# Patient Record
Sex: Female | Born: 1975 | Race: Black or African American | Hispanic: No | Marital: Single | Smoking: Never smoker
Health system: Southern US, Community
[De-identification: ages and names within clinical notes are randomized; demographics above are authoritative.]

---

## 2004-11-30 ENCOUNTER — Other Ambulatory Visit: Admission: RE | Admit: 2004-11-30 | Discharge: 2004-11-30 | Payer: Self-pay | Admitting: Family Medicine

## 2007-11-06 ENCOUNTER — Other Ambulatory Visit: Admission: RE | Admit: 2007-11-06 | Discharge: 2007-11-06 | Payer: Self-pay | Admitting: Family Medicine

## 2008-06-12 ENCOUNTER — Encounter: Admission: RE | Admit: 2008-06-12 | Discharge: 2008-06-12 | Payer: Self-pay | Admitting: Family Medicine

## 2009-05-07 ENCOUNTER — Other Ambulatory Visit: Admission: RE | Admit: 2009-05-07 | Discharge: 2009-05-07 | Payer: Self-pay | Admitting: Family Medicine

## 2009-09-23 ENCOUNTER — Ambulatory Visit: Admission: RE | Admit: 2009-09-23 | Discharge: 2009-09-23 | Payer: Self-pay | Admitting: Family Medicine

## 2011-08-16 ENCOUNTER — Other Ambulatory Visit (HOSPITAL_COMMUNITY)
Admission: RE | Admit: 2011-08-16 | Discharge: 2011-08-16 | Disposition: A | Discharge status: Home / Self Care | Payer: 59 | Source: Ambulatory Visit | Attending: Family Medicine | Admitting: Family Medicine

## 2011-08-16 DIAGNOSIS — Z113 Encounter for screening for infections with a predominantly sexual mode of transmission: Secondary | ICD-10-CM | POA: Insufficient documentation

## 2011-08-16 DIAGNOSIS — Z0000 Encounter for general adult medical examination without abnormal findings: Secondary | ICD-10-CM | POA: Insufficient documentation

## 2012-09-26 ENCOUNTER — Other Ambulatory Visit (HOSPITAL_COMMUNITY)
Admission: RE | Admit: 2012-09-26 | Discharge: 2012-09-26 | Disposition: A | Discharge status: Home / Self Care | Payer: 59 | Source: Ambulatory Visit | Attending: Family Medicine | Admitting: Family Medicine

## 2012-09-26 DIAGNOSIS — Z01419 Encounter for gynecological examination (general) (routine) without abnormal findings: Secondary | ICD-10-CM | POA: Insufficient documentation

## 2013-10-17 ENCOUNTER — Other Ambulatory Visit (HOSPITAL_COMMUNITY)
Admission: RE | Admit: 2013-10-17 | Discharge: 2013-10-17 | Disposition: A | Discharge status: Home / Self Care | Payer: 59 | Source: Ambulatory Visit | Attending: Physician Assistant | Admitting: Physician Assistant

## 2013-10-17 DIAGNOSIS — Z01419 Encounter for gynecological examination (general) (routine) without abnormal findings: Secondary | ICD-10-CM | POA: Insufficient documentation

## 2013-10-17 DIAGNOSIS — Z113 Encounter for screening for infections with a predominantly sexual mode of transmission: Secondary | ICD-10-CM | POA: Insufficient documentation

## 2016-03-11 ENCOUNTER — Emergency Department (HOSPITAL_BASED_OUTPATIENT_CLINIC_OR_DEPARTMENT_OTHER)
Admission: EM | Admit: 2016-03-11 | Discharge: 2016-03-11 | Disposition: A | Discharge status: Home / Self Care | Payer: Worker's Compensation | Attending: Emergency Medicine | Admitting: Emergency Medicine

## 2016-03-11 ENCOUNTER — Encounter (HOSPITAL_BASED_OUTPATIENT_CLINIC_OR_DEPARTMENT_OTHER): Payer: Self-pay

## 2016-03-11 ENCOUNTER — Emergency Department (HOSPITAL_BASED_OUTPATIENT_CLINIC_OR_DEPARTMENT_OTHER): Payer: Worker's Compensation

## 2016-03-11 DIAGNOSIS — Y9263 Factory as the place of occurrence of the external cause: Secondary | ICD-10-CM | POA: Insufficient documentation

## 2016-03-11 DIAGNOSIS — IMO0002 Reserved for concepts with insufficient information to code with codable children: Secondary | ICD-10-CM

## 2016-03-11 DIAGNOSIS — R0789 Other chest pain: Secondary | ICD-10-CM

## 2016-03-11 DIAGNOSIS — Y9301 Activity, walking, marching and hiking: Secondary | ICD-10-CM | POA: Insufficient documentation

## 2016-03-11 DIAGNOSIS — S21112A Laceration without foreign body of left front wall of thorax without penetration into thoracic cavity, initial encounter: Secondary | ICD-10-CM | POA: Diagnosis not present

## 2016-03-11 DIAGNOSIS — W01198A Fall on same level from slipping, tripping and stumbling with subsequent striking against other object, initial encounter: Secondary | ICD-10-CM | POA: Diagnosis not present

## 2016-03-11 DIAGNOSIS — Y990 Civilian activity done for income or pay: Secondary | ICD-10-CM | POA: Diagnosis not present

## 2016-03-11 MED ORDER — TETANUS-DIPHTH-ACELL PERTUSSIS 5-2.5-18.5 LF-MCG/0.5 IM SUSP
0.5000 mL | Freq: Once | INTRAMUSCULAR | Status: AC
  Administered 2016-03-11: 0.5 mL via INTRAMUSCULAR
  Filled 2016-03-11: qty 0.5

## 2016-03-11 NOTE — Discharge Instructions (Signed)
Keep wound covered with nonstick bandages (Tefla). You may apply OTC antibiotic ointment such as neosporin or polysporin. Return to ED with new, worsening or concerning symptoms.   Wound Care Taking care of your wound properly can help to prevent pain and infection. It can also help your wound to heal more quickly.  HOW TO CARE FOR YOUR WOUND  Take or apply over-the-counter and prescription medicines only as told by your health care provider.  If you were prescribed antibiotic medicine, take or apply it as told by your health care provider. Do not stop using the antibiotic even if your condition improves.  Clean the wound each day or as told by your health care provider.  Wash the wound with mild soap and water.  Rinse the wound with water to remove all soap.  Pat the wound dry with a clean towel. Do not rub it.  There are many different ways to close and cover a wound. For example, a wound can be covered with stitches (sutures), skin glue, or adhesive strips. Follow instructions from your health care provider about:  How to take care of your wound.  When and how you should change your bandage (dressing).  When you should remove your dressing.  Removing whatever was used to close your wound.  Check your wound every day for signs of infection. Watch for:  Redness, swelling, or pain.  Fluid, blood, or pus.  Keep the dressing dry until your health care provider says it can be removed. Do not take baths, swim, use a hot tub, or do anything that would put your wound underwater until your health care provider approves.  Raise (elevate) the injured area above the level of your heart while you are sitting or lying down.  Do not scratch or pick at the wound.  Keep all follow-up visits as told by your health care provider. This is important. SEEK MEDICAL CARE IF:  You received a tetanus shot and you have swelling, severe pain, redness, or bleeding at the injection site.  You have  a fever.  Your pain is not controlled with medicine.  You have increased redness, swelling, or pain at the site of your wound.  You have fluid, blood, or pus coming from your wound.  You notice a bad smell coming from your wound or your dressing. SEEK IMMEDIATE MEDICAL CARE IF:  You have a red streak going away from your wound.   This information is not intended to replace advice given to you by your health care provider. Make sure you discuss any questions you have with your health care provider.   Document Released: 07/27/2008 Document Revised: 03/04/2015 Document Reviewed: 10/14/2014 Elsevier Interactive Patient Education 2016 [employer]   Chest Wall Pain Chest wall pain is pain in or around the bones and muscles of your chest. Sometimes, an injury causes this pain. Sometimes, the cause may not be known. This pain may take several weeks or longer to get better. HOME CARE INSTRUCTIONS  Pay attention to any changes in your symptoms. Take these actions to help with your pain:   Rest as told by your health care provider.   Avoid activities that cause pain. These include any activities that use your chest muscles or your abdominal and side muscles to lift heavy items.   If directed, apply ice to the painful area:  Put ice in a plastic bag.  Place a towel between your skin and the bag.  Leave the ice on for 20 minutes,  2-3 times per day.  Take over-the-counter and prescription medicines only as told by your health care provider.  Do not use tobacco products, including cigarettes, chewing tobacco, and e-cigarettes. If you need help quitting, ask your health care provider.  Keep all follow-up visits as told by your health care provider. This is important. SEEK MEDICAL CARE IF:  You have a fever.  Your chest pain becomes worse.  You have new symptoms. SEEK IMMEDIATE MEDICAL CARE IF:  You have nausea or vomiting.  You feel sweaty or light-headed.  You have a  cough with phlegm (sputum) or you cough up blood.  You develop shortness of breath.   This information is not intended to replace advice given to you by your health care provider. Make sure you discuss any questions you have with your health care provider.   Document Released: 10/18/2005 Document Revised: 07/09/2015 Document Reviewed: 01/13/2015 Elsevier Interactive Patient Education [employer]

## 2016-03-11 NOTE — ED Notes (Signed)
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

## 2016-03-11 NOTE — ED Provider Notes (Signed)
CSN: [id number]     Arrival date & time 03/11/16  1127 History   First MD Initiated Contact with Patient 03/11/16 1128     Chief Complaint  Patient presents with  . Laceration   Patient is a 40 y.o. female presenting with skin laceration.  Laceration Location:  Trunk Trunk laceration location:  L chest Length (cm):  6 Depth:  Cutaneous Quality: straight   Bleeding: controlled   Laceration mechanism:  Metal edge Pain details:    Quality: Tender.   Severity:  Mild Foreign body present:  No foreign bodies Relieved by:  None tried Tetanus status:  Unknown  Sheila Tanner is a 40 year old female presenting with skin laceration. 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 She struck her left sided chest against the metal edge and sustained a superficial laceration to her anterior chest wall. Small amount of bleeding noted which resolved with wound dressing. She reports moderate pain of the anterior chest where she struck the frame. Denies SOB or any difficulty breathing. She has no other complaints today. Unsure of last tetanus.   History reviewed. No pertinent past medical history. History reviewed. No pertinent past surgical history. No family history on file. Social History  Substance Use Topics  . Smoking status: Never Smoker   . Smokeless tobacco: None  . Alcohol Use: Yes     Comment: occ   OB History    No data available     Review of Systems  All other systems reviewed and are negative.     Allergies  Review of patient's allergies indicates no known allergies.  Home Medications   Prior to Admission medications   Not on File   BP 137/89 mmHg  Pulse 94  Temp(Src) 98.8 F (37.1 C) (Oral)  Resp 18  Ht 5\' 1"  (1.549 m)  Wt 90.719 kg  BMI 37.81 kg/m2  SpO2 98%  LMP 02/27/2016 Physical Exam  Constitutional: She appears well-developed and well-nourished. No distress.  HENT:  Head: Normocephalic and atraumatic.  Eyes: Conjunctivae  are normal. Right eye exhibits no discharge. Left eye exhibits no discharge. No scleral icterus.  Neck: Normal range of motion.  Cardiovascular: Normal rate and regular rhythm.   Pulmonary/Chest: Effort normal. No respiratory distress. She exhibits tenderness.  Mild TTP over left anterior chest wall at site of abrasion. No bony deformities of the chest wall. Breathing unlabored with good air movement in all fields.   Musculoskeletal: Normal range of motion.  Neurological: She is alert. Coordination normal.  Skin: Skin is warm and dry.  Linear skin abrasion noted to left anterior chest wall. Length of approximately 6 cm. Wound is superficial with a depth of less than 5 mm. No foreign bodies. No active bleeding. Mild soft tissue swelling at site. Wound does not require suture repair.   Psychiatric: She has a normal mood and affect. Her behavior is normal.  Nursing note and vitals reviewed.   ED Course  Procedures (including critical care time) Labs Review Labs Reviewed - No data to display  Imaging Review Dg Chest 2 View  03/11/2016  CLINICAL DATA:  Super sternal chest wall laceration at work today. Initial encounter. EXAM: CHEST  2 VIEW COMPARISON:  None. FINDINGS: The heart size and mediastinal contours are normal. The lungs are clear. There is no pleural effusion or pneumothorax. No acute osseous findings are identified. No soft tissue emphysema, suprasternal soft tissue swelling or foreign body identified. IMPRESSION: No evidence  of acute chest injury. No active cardiopulmonary process. Electronically Signed   By: Carey Bullocks M.D.   On: 03/11/2016 12:26   I have personally reviewed and evaluated these images and lab results as part of my medical decision-making.   EKG Interpretation None      MDM   Final diagnoses:  Laceration  Chest wall pain   40 year old female presenting with skin laceration after a fall. Pt struck left anterior chest against metal edge resulting in  tenderness and laceration. Pt initially hypertensive in triage which improved to 37/89 on re-measure. Pt is nontoxic appearing. 6 cm superficial, linear abrasion noted to left anterior chest. Surrounding TTP. No deformities of left chest wall. Xray negative for acute injury. TDAP updated. Wound does not require suture repair. Will cover with Tefla and instruct pt on wound care including dressing changes and abx ointment. Discussed reasons to return to ED including signs of infection or change in chest pain. Return precautions given in discharge paperwork and discussed with pt at bedside. Pt stable for discharge     Alveta Heimlich, PA-C 03/11/16 1311  Arby Barrette, MD 03/11/16 [phone]

## 2016-12-17 DIAGNOSIS — J309 Allergic rhinitis, unspecified: Secondary | ICD-10-CM | POA: Diagnosis not present

## 2017-06-17 ENCOUNTER — Other Ambulatory Visit: Payer: Self-pay | Admitting: Family Medicine

## 2017-06-17 ENCOUNTER — Other Ambulatory Visit (HOSPITAL_COMMUNITY)
Admission: RE | Admit: 2017-06-17 | Discharge: 2017-06-17 | Disposition: A | Discharge status: Home / Self Care | Payer: 59 | Source: Ambulatory Visit | Attending: Family Medicine | Admitting: Family Medicine

## 2017-06-17 DIAGNOSIS — Z0000 Encounter for general adult medical examination without abnormal findings: Secondary | ICD-10-CM | POA: Diagnosis not present

## 2017-06-17 DIAGNOSIS — E7800 Pure hypercholesterolemia, unspecified: Secondary | ICD-10-CM | POA: Diagnosis not present

## 2017-06-17 DIAGNOSIS — Z01419 Encounter for gynecological examination (general) (routine) without abnormal findings: Secondary | ICD-10-CM | POA: Diagnosis not present

## 2017-06-17 DIAGNOSIS — I10 Essential (primary) hypertension: Secondary | ICD-10-CM | POA: Diagnosis not present

## 2017-06-21 LAB — CYTOLOGY - PAP: DIAGNOSIS: NEGATIVE

## 2017-07-18 DIAGNOSIS — Z713 Dietary counseling and surveillance: Secondary | ICD-10-CM | POA: Diagnosis not present

## 2017-07-18 DIAGNOSIS — I10 Essential (primary) hypertension: Secondary | ICD-10-CM | POA: Diagnosis not present

## 2017-08-02 DIAGNOSIS — Z23 Encounter for immunization: Secondary | ICD-10-CM | POA: Diagnosis not present

## 2017-08-16 DIAGNOSIS — Z1231 Encounter for screening mammogram for malignant neoplasm of breast: Secondary | ICD-10-CM | POA: Diagnosis not present

## 2017-10-05 DIAGNOSIS — R928 Other abnormal and inconclusive findings on diagnostic imaging of breast: Secondary | ICD-10-CM | POA: Diagnosis not present

## 2018-01-19 DIAGNOSIS — I10 Essential (primary) hypertension: Secondary | ICD-10-CM | POA: Diagnosis not present

## 2018-02-22 DIAGNOSIS — J019 Acute sinusitis, unspecified: Secondary | ICD-10-CM | POA: Diagnosis not present

## 2018-02-22 DIAGNOSIS — R6883 Chills (without fever): Secondary | ICD-10-CM | POA: Diagnosis not present

## 2018-02-22 DIAGNOSIS — R110 Nausea: Secondary | ICD-10-CM | POA: Diagnosis not present

## 2018-06-20 DIAGNOSIS — E7800 Pure hypercholesterolemia, unspecified: Secondary | ICD-10-CM | POA: Diagnosis not present

## 2018-06-20 DIAGNOSIS — Z0000 Encounter for general adult medical examination without abnormal findings: Secondary | ICD-10-CM | POA: Diagnosis not present

## 2018-06-20 DIAGNOSIS — I10 Essential (primary) hypertension: Secondary | ICD-10-CM | POA: Diagnosis not present

## 2018-06-20 DIAGNOSIS — R109 Unspecified abdominal pain: Secondary | ICD-10-CM | POA: Diagnosis not present

## 2018-06-20 DIAGNOSIS — H6121 Impacted cerumen, right ear: Secondary | ICD-10-CM | POA: Diagnosis not present

## 2018-07-21 DIAGNOSIS — R1084 Generalized abdominal pain: Secondary | ICD-10-CM | POA: Diagnosis not present

## 2018-07-21 DIAGNOSIS — K5900 Constipation, unspecified: Secondary | ICD-10-CM | POA: Diagnosis not present

## 2018-08-21 DIAGNOSIS — K5900 Constipation, unspecified: Secondary | ICD-10-CM | POA: Diagnosis not present

## 2018-08-24 DIAGNOSIS — Z1231 Encounter for screening mammogram for malignant neoplasm of breast: Secondary | ICD-10-CM | POA: Diagnosis not present

## 2019-06-18 ENCOUNTER — Other Ambulatory Visit: Payer: Self-pay

## 2019-06-18 DIAGNOSIS — Z20822 Contact with and (suspected) exposure to covid-19: Secondary | ICD-10-CM

## 2019-06-20 LAB — SPECIMEN STATUS REPORT

## 2019-06-20 LAB — NOVEL CORONAVIRUS, NAA: SARS-CoV-2, NAA: NOT DETECTED

## 2019-10-01 ENCOUNTER — Other Ambulatory Visit: Payer: Self-pay

## 2019-10-01 DIAGNOSIS — Z20822 Contact with and (suspected) exposure to covid-19: Secondary | ICD-10-CM

## 2019-10-03 LAB — NOVEL CORONAVIRUS, NAA: SARS-CoV-2, NAA: NOT DETECTED

## 2019-10-05 ENCOUNTER — Telehealth: Payer: Self-pay | Admitting: General Practice

## 2019-10-05 NOTE — Telephone Encounter (Signed)
Pt returned call for covid result °Advised of Not Detected result.  °

## 2020-06-26 ENCOUNTER — Other Ambulatory Visit (HOSPITAL_COMMUNITY)
Admission: RE | Admit: 2020-06-26 | Discharge: 2020-06-26 | Disposition: A | Discharge status: Home / Self Care | Payer: 59 | Source: Ambulatory Visit | Attending: Family Medicine | Admitting: Family Medicine

## 2020-06-26 ENCOUNTER — Other Ambulatory Visit: Payer: Self-pay | Admitting: Family Medicine

## 2020-06-26 DIAGNOSIS — Z0000 Encounter for general adult medical examination without abnormal findings: Secondary | ICD-10-CM | POA: Insufficient documentation

## 2020-06-30 LAB — CYTOLOGY - PAP: Diagnosis: NEGATIVE

## 2021-06-30 ENCOUNTER — Other Ambulatory Visit: Payer: Self-pay | Admitting: Family Medicine

## 2021-06-30 DIAGNOSIS — M7989 Other specified soft tissue disorders: Secondary | ICD-10-CM

## 2021-07-13 ENCOUNTER — Ambulatory Visit
Admission: RE | Admit: 2021-07-13 | Discharge: 2021-07-13 | Disposition: A | Discharge status: Home / Self Care | Payer: 59 | Source: Ambulatory Visit | Attending: Family Medicine | Admitting: Family Medicine

## 2021-07-13 DIAGNOSIS — M7989 Other specified soft tissue disorders: Secondary | ICD-10-CM

## 2021-08-05 ENCOUNTER — Other Ambulatory Visit: Payer: Self-pay | Admitting: Family Medicine

## 2021-08-05 DIAGNOSIS — M7989 Other specified soft tissue disorders: Secondary | ICD-10-CM

## 2021-08-05 DIAGNOSIS — R1033 Periumbilical pain: Secondary | ICD-10-CM

## 2021-08-21 ENCOUNTER — Ambulatory Visit
Admission: RE | Admit: 2021-08-21 | Discharge: 2021-08-21 | Disposition: A | Discharge status: Home / Self Care | Payer: 59 | Source: Ambulatory Visit | Attending: Family Medicine | Admitting: Family Medicine

## 2021-08-21 DIAGNOSIS — M7989 Other specified soft tissue disorders: Secondary | ICD-10-CM

## 2021-08-21 DIAGNOSIS — R1033 Periumbilical pain: Secondary | ICD-10-CM

## 2021-08-21 MED ORDER — IOPAMIDOL (ISOVUE-300) INJECTION 61%
100.0000 mL | Freq: Once | INTRAVENOUS | Status: AC | PRN
  Administered 2021-08-21: 100 mL via INTRAVENOUS

## 2021-09-02 ENCOUNTER — Other Ambulatory Visit: Payer: 59

## 2021-09-08 ENCOUNTER — Ambulatory Visit: Payer: Self-pay | Admitting: Allergy & Immunology

## 2021-10-26 IMAGING — US US ABDOMEN LIMITED
1 series · 8 of 8 positions shown · non-contrast
Comparison: No recent prior.

CLINICAL DATA: Soft tissue nodule just to the right side of the
umbilicus.

EXAM:
ULTRASOUND ABDOMEN LIMITED

[Series 1: us abdomen limited · 0.08mm/px · 8 acquisitions, 8 frames shown]
[im 1/8]
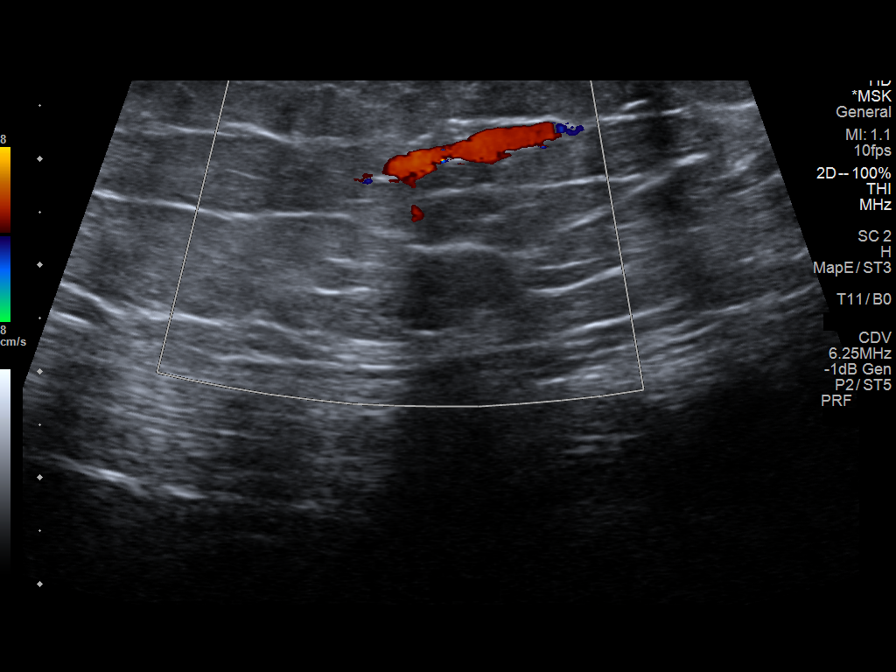
[im 2/8]
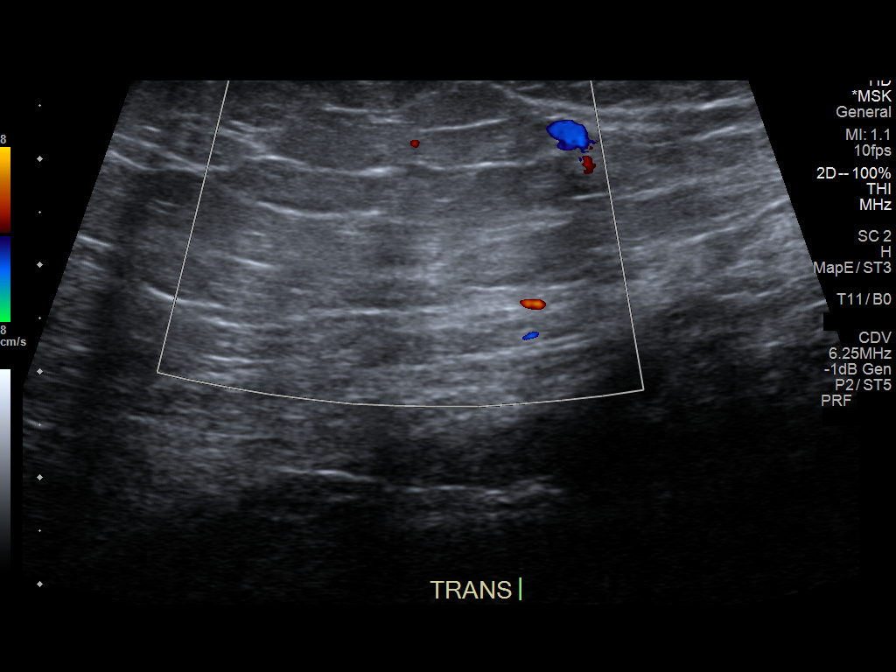
[im 3/8]
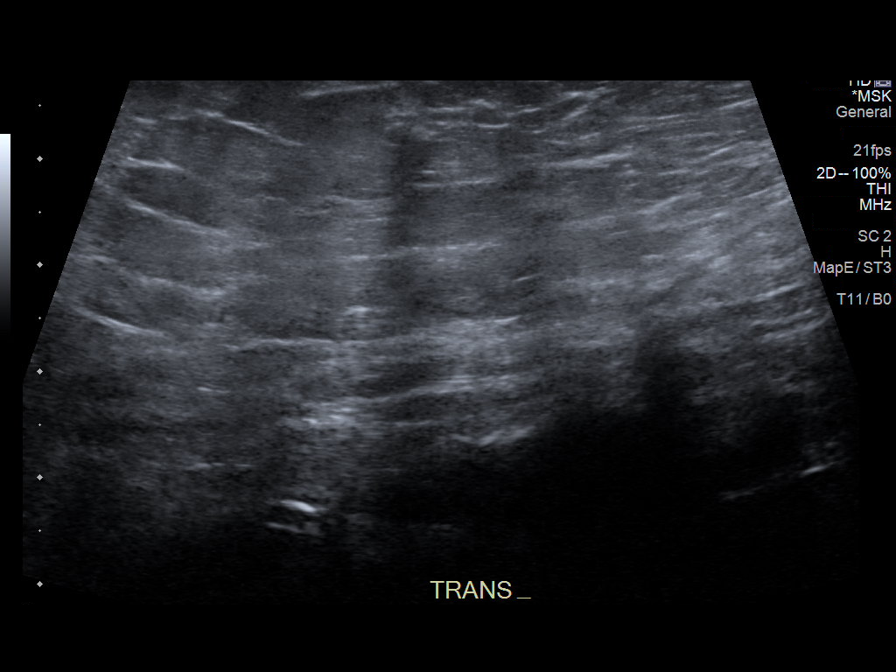
[im 4/8]
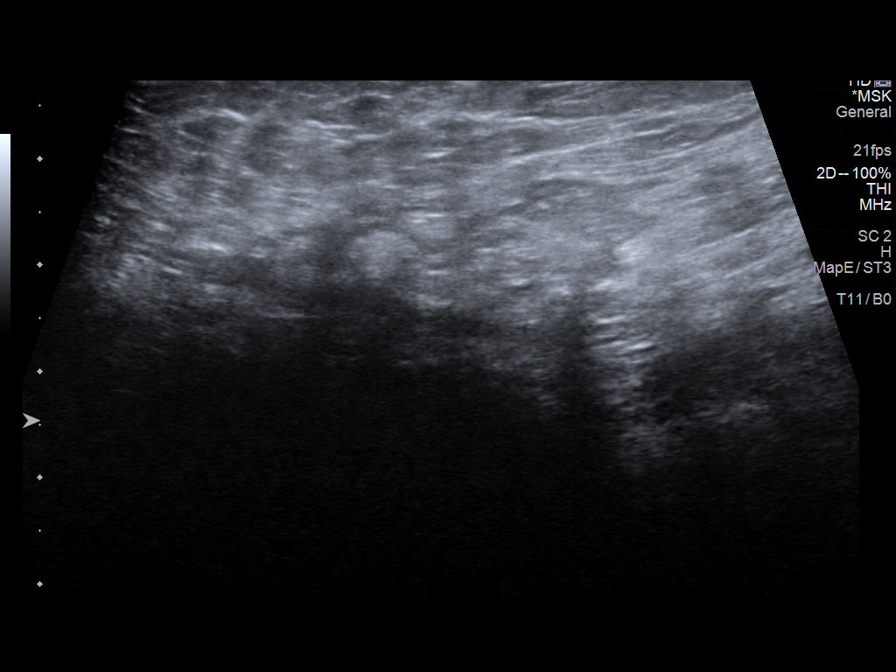
[im 5/8]
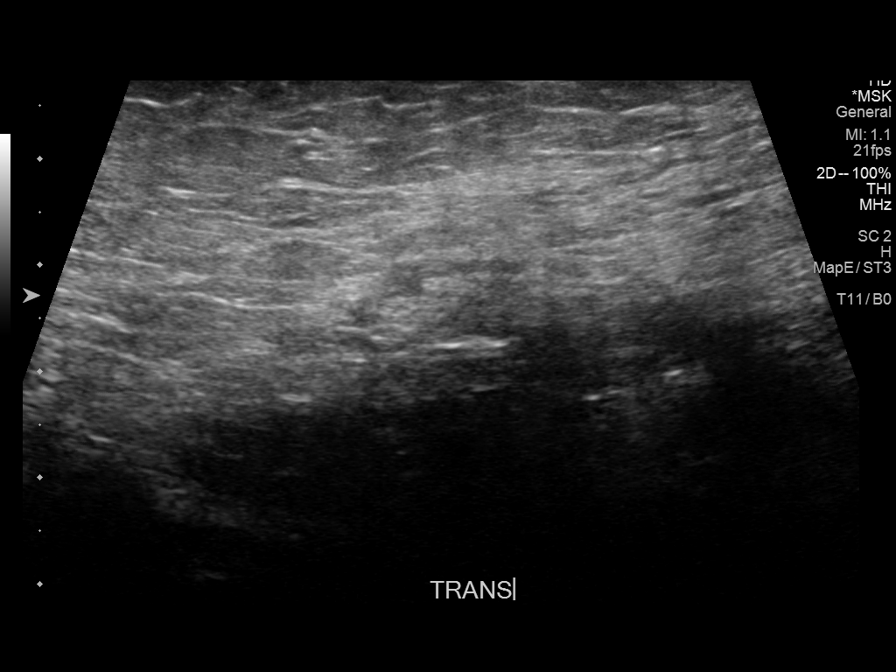
[im 6/8]
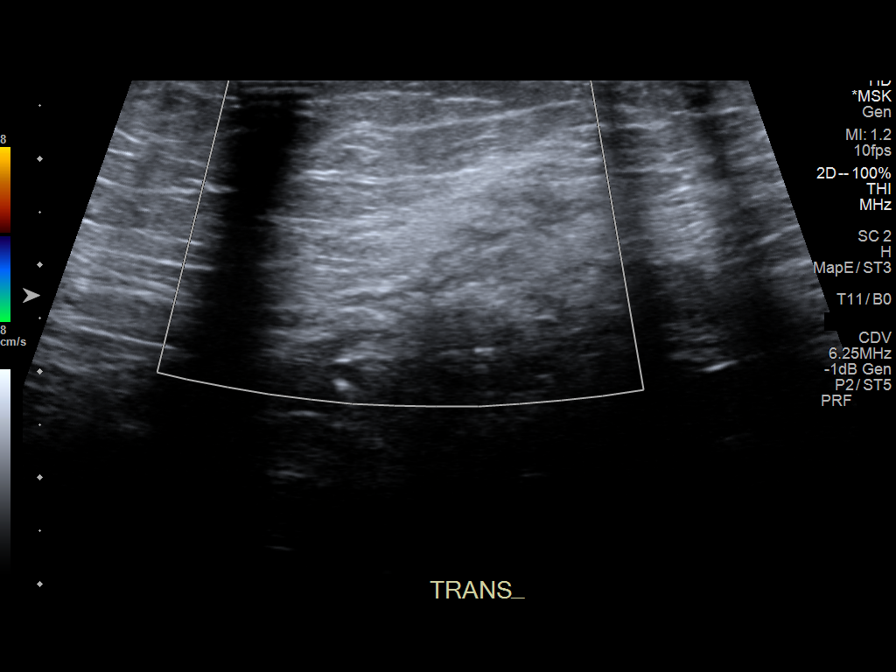
[im 7/8]
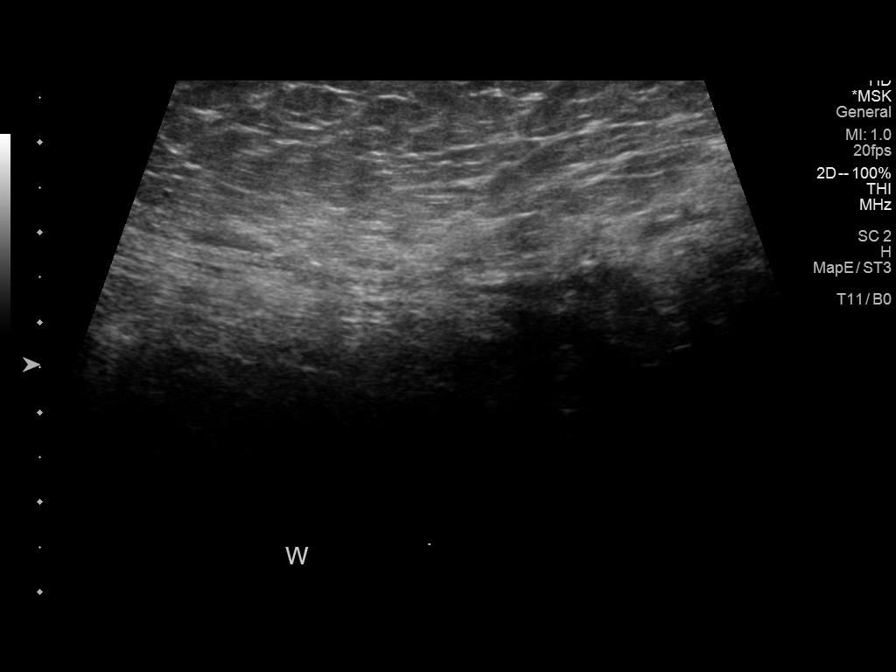
[im 8/8]
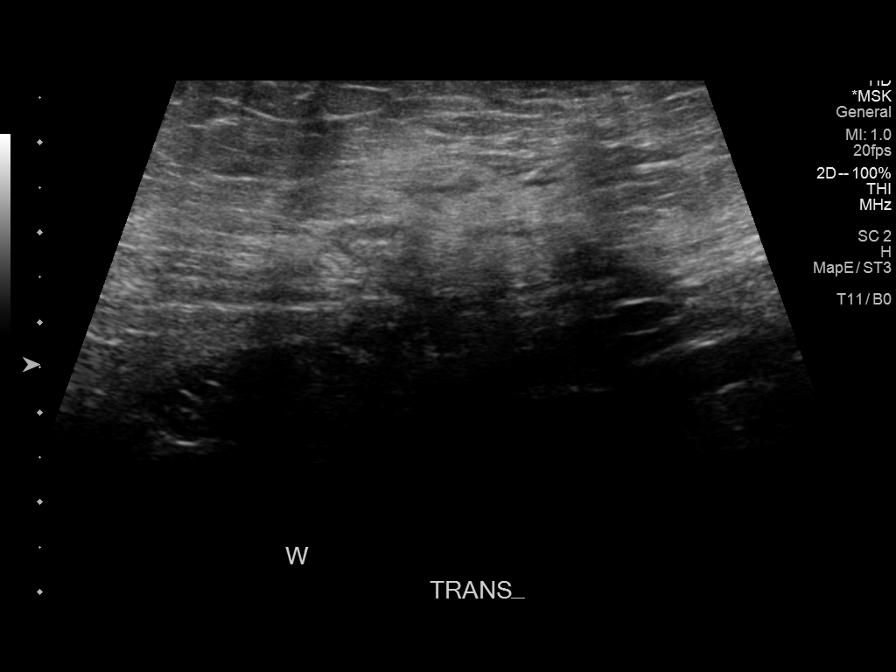

[8 of 8 positions shown; findings below may reference images not displayed]

FINDINGS: No evidence of soft tissue mass or fluid collection. No hernia
noted. Tenderness on ultrasound exam was noted in the region of
clinical concern.
IMPRESSION: No focal abnormality identified. No hernia noted. Tendinous on
ultrasound exam was noted the region of clinical concern.

## 2024-07-16 ENCOUNTER — Encounter (HOSPITAL_COMMUNITY): Payer: Self-pay

## 2024-07-16 ENCOUNTER — Other Ambulatory Visit: Payer: Self-pay

## 2024-07-16 ENCOUNTER — Emergency Department (HOSPITAL_COMMUNITY)
Admission: EM | Admit: 2024-07-16 | Discharge: 2024-07-16 | Disposition: A | Discharge status: Home / Self Care | Attending: Emergency Medicine | Admitting: Emergency Medicine

## 2024-07-16 ENCOUNTER — Emergency Department (HOSPITAL_COMMUNITY)

## 2024-07-16 DIAGNOSIS — K5900 Constipation, unspecified: Secondary | ICD-10-CM | POA: Diagnosis not present

## 2024-07-16 DIAGNOSIS — R1031 Right lower quadrant pain: Secondary | ICD-10-CM | POA: Diagnosis present

## 2024-07-16 DIAGNOSIS — I10 Essential (primary) hypertension: Secondary | ICD-10-CM | POA: Diagnosis not present

## 2024-07-16 LAB — CBC
HCT: 42.7 % (ref 36.0–46.0)
Hemoglobin: 12.8 g/dL (ref 12.0–15.0)
MCH: 27.9 pg (ref 26.0–34.0)
MCHC: 30 g/dL (ref 30.0–36.0)
MCV: 93 fL (ref 80.0–100.0)
Platelets: 374 K/uL (ref 150–400)
RBC: 4.59 MIL/uL (ref 3.87–5.11)
RDW: 14 % (ref 11.5–15.5)
WBC: 8 K/uL (ref 4.0–10.5)
nRBC: 0 % (ref 0.0–0.2)

## 2024-07-16 LAB — URINALYSIS, ROUTINE W REFLEX MICROSCOPIC
Bacteria, UA: NONE SEEN
Bilirubin Urine: NEGATIVE
Glucose, UA: NEGATIVE mg/dL
Hgb urine dipstick: NEGATIVE
Ketones, ur: NEGATIVE mg/dL
Nitrite: NEGATIVE
Protein, ur: NEGATIVE mg/dL
Specific Gravity, Urine: 1.03 (ref 1.005–1.030)
pH: 5 (ref 5.0–8.0)

## 2024-07-16 LAB — HCG, SERUM, QUALITATIVE: Preg, Serum: NEGATIVE

## 2024-07-16 LAB — COMPREHENSIVE METABOLIC PANEL WITH GFR
ALT: 16 U/L (ref 0–44)
AST: 14 U/L — ABNORMAL LOW (ref 15–41)
Albumin: 4.3 g/dL (ref 3.5–5.0)
Alkaline Phosphatase: 68 U/L (ref 38–126)
Anion gap: 11 (ref 5–15)
BUN: 9 mg/dL (ref 6–20)
CO2: 25 mmol/L (ref 22–32)
Calcium: 9.5 mg/dL (ref 8.9–10.3)
Chloride: 101 mmol/L (ref 98–111)
Creatinine, Ser: 0.62 mg/dL (ref 0.44–1.00)
GFR, Estimated: >60 mL/min (ref >60)
Glucose, Bld: 80 mg/dL (ref 70–99)
Potassium: 3.7 mmol/L (ref 3.5–5.1)
Sodium: 138 mmol/L (ref 135–145)
Total Bilirubin: 0.5 mg/dL (ref 0.0–1.2)
Total Protein: 8 g/dL (ref 6.5–8.1)

## 2024-07-16 LAB — LIPASE, BLOOD: Lipase: 28 U/L (ref 11–51)

## 2024-07-16 MED ORDER — DICYCLOMINE HCL 20 MG PO TABS: 20.0000 mg | ORAL_TABLET | Freq: Two times a day (BID) | ORAL | 0 refills | Status: AC

## 2024-07-16 MED ORDER — POLYETHYLENE GLYCOL 3350 17 G PO PACK
17.0000 g | PACK | Freq: Every day | ORAL | 0 refills | Status: AC
Start: 2024-07-16 — End: ?

## 2024-07-16 MED ORDER — OXYCODONE-ACETAMINOPHEN 5-325 MG PO TABS
1.0000 | ORAL_TABLET | Freq: Once | ORAL | Status: AC
  Administered 2024-07-16: 1 via ORAL
  Filled 2024-07-16: qty 1

## 2024-07-16 MED ORDER — IOHEXOL 300 MG/ML  SOLN
100.0000 mL | Freq: Once | INTRAMUSCULAR | Status: AC | PRN
Start: 2024-07-16 — End: 2024-07-16
  Administered 2024-07-16: 100 mL via INTRAVENOUS

## 2024-07-16 NOTE — ED Provider Triage Note (Signed)
 Emergency Medicine Provider Triage Evaluation Note  Sheila Tanner , a 48 y.o. female  was evaluated in triage.  Pt complains of RLQ pain for hours with N. No urinary symptoms.   Review of Systems  Positive: Abdominal pain Negative: Fever  Physical Exam  BP (!) 145/91 (BP Location: Left Arm)   Pulse (!) 107   Temp 97.9 F (36.6 C) (Oral)   Resp 16   Ht 5' 1 (1.549 m)   Wt 90.7 kg   SpO2 100%   BMI 37.79 kg/m  Gen:   Awake, no distress   Resp:  Normal effort  MSK:   Moves extremities without difficulty  Other:  RLQ TTP  Medical Decision Making  Medically screening exam initiated at 3:56 PM.  Appropriate orders placed.  Sheila Tanner was informed that the remainder of the evaluation will be completed by another provider, this initial triage assessment does not replace that evaluation, and the importance of remaining in the ED until their evaluation is complete.     Sheila Warren SAILOR, PA-C 07/16/24 1557

## 2024-07-16 NOTE — Discharge Instructions (Signed)
 Your labs and imaging are overall reassuring.  Your CT scan does suggest constipation. You can trial MiraLAX .  Make sure you are increasing fiber in diet as well as water.  Please follow-up with primary care follow up on today's results and for recheck of symptoms and return to ER with new or worsening symptoms.

## 2024-07-16 NOTE — ED Triage Notes (Signed)
 Pt presents to ED from home C/O RLQ pain. Endorses nausea. Denies vomiting, diarrhea. Last BM yesterday.

## 2024-07-16 NOTE — ED Provider Notes (Signed)
 [place] EMERGENCY DEPARTMENT AT [facility] Provider Note   CSN: [id number] Arrival date & time: 07/16/24  1516     Patient presents with: Abdominal Pain   Sheila Tanner is a 48 y.o. female with noncontributory past medical history is reporting to emergency room with complaint of right lower quadrant abdominal pain that started this morning.  This is associated with nausea but no vomiting diarrhea.  Patient is passing gas.  {Add pertinent medical, surgical, social history, OB history to HPI:32947}  Abdominal Pain      Prior to Admission medications   Not on File    Allergies: Patient has no known allergies.    Review of Systems  Gastrointestinal:  Positive for abdominal pain.    Updated Vital Signs BP (!) 145/91 (BP Location: Left Arm)   Pulse (!) 107   Temp 97.9 F (36.6 C) (Oral)   Resp 16   Ht 5' 1 (1.549 m)   Wt 90.7 kg   SpO2 100%   BMI 37.79 kg/m   Physical Exam Vitals and nursing note reviewed.  Constitutional:      General: She is not in acute distress.    Appearance: She is not toxic-appearing.  HENT:     Head: Normocephalic and atraumatic.  Eyes:     General: No scleral icterus.    Conjunctiva/sclera: Conjunctivae normal.  Cardiovascular:     Rate and Rhythm: Normal rate and regular rhythm.     Pulses: Normal pulses.     Heart sounds: Normal heart sounds.  Pulmonary:     Effort: Pulmonary effort is normal. No respiratory distress.     Breath sounds: Normal breath sounds.  Abdominal:     General: Abdomen is flat. Bowel sounds are normal.     Palpations: Abdomen is soft.     Tenderness: There is abdominal tenderness.     Comments: Patient has generalized tenderness to lower abdomen including left lower suprapubic and right lower quadrant.  No rebound tenderness.  Musculoskeletal:     Right lower leg: No edema.     Left lower leg: No edema.  Skin:    General: Skin is warm and dry.     Findings: No lesion.  Neurological:      General: No focal deficit present.     Mental Status: She is alert and oriented to person, place, and time. Mental status is at baseline.     (all labs ordered are listed, but only abnormal results are displayed) Labs Reviewed  COMPREHENSIVE METABOLIC PANEL WITH GFR - Abnormal; Notable for the following components:      Result Value   AST 14 (*)    All other components within normal limits  URINALYSIS, ROUTINE W REFLEX MICROSCOPIC - Abnormal; Notable for the following components:   APPearance HAZY (*)    Leukocytes,Ua SMALL (*)    All other components within normal limits  LIPASE, BLOOD  CBC  HCG, SERUM, QUALITATIVE    EKG: None  Radiology: CT ABDOMEN PELVIS W CONTRAST Result Date: 07/16/2024 CLINICAL DATA:  Right lower quadrant pain and nausea. EXAM: CT ABDOMEN AND PELVIS WITH CONTRAST TECHNIQUE: Multidetector CT imaging of the abdomen and pelvis was performed using the standard protocol following bolus administration of intravenous contrast. RADIATION DOSE REDUCTION: This exam was performed according to the departmental dose-optimization program which includes automated exposure control, adjustment of the mA and/or kV according to patient size and/or use of iterative reconstruction technique. CONTRAST:  OMNIPAQUE  IOHEXOL  300 MG/ML  SOLN COMPARISON:  CT abdomen with IV contrast 08/21/2021. FINDINGS: Lower chest: No acute abnormality. Hepatobiliary: The liver is mildly steatotic. In the inferior aspect of segment 5, there is a 1.1 cm cyst with Hounsfield density 18.5, and a 2.0 cm cyst with Hounsfield density of 12. There is no mass enhancement or other significant findings of the liver. The gallbladder bile ducts are unremarkable. Pancreas: No abnormality. Spleen: No abnormality. Adrenals/Urinary Tract: No abnormality. Stomach/Bowel: No dilatation or wall thickening including the appendix. Moderate fecal stasis ascending and transverse colon. Uncomplicated diverticulosis descending  and sigmoid colon. Vascular/Lymphatic: No significant vascular findings are present. No enlarged abdominal or pelvic lymph nodes. Reproductive: Status post hysterectomy. No adnexal masses. Other: Small umbilical fat hernia. Pelvic phleboliths. No free fluid, free hemorrhage or free air. No incarcerated hernia. Musculoskeletal: Mild nonerosive sacroiliitis right-greater-than-left. Lumbar facet joint spurring. 1.5 cm bone island posterior column of left acetabulum. No acute or other significant osseous findings. IMPRESSION: 1. No acute CT findings in the abdomen or pelvis. 2. Constipation and diverticulosis. 3. Mild hepatic steatosis. 4. Small umbilical fat hernia. 5. Mild nonerosive sacroiliitis right-greater-than-left. Electronically Signed   By: Francis Quam M.D.   On: 07/16/2024 21:01    {Document cardiac monitor, telemetry assessment procedure when appropriate:32947} Procedures   Medications Ordered in the ED  oxyCODONE -acetaminophen  (PERCOCET/ROXICET) 5-325 MG per tablet 1 tablet (1 tablet Oral Given 07/16/24 1818)      {Click here for ABCD2, HEART and other calculators REFRESH Note before signing:1}                              Medical Decision Making Amount and/or Complexity of Data Reviewed Labs: ordered. Radiology: ordered.  Risk OTC drugs. Prescription drug management.   This patient presents to the ED for concern of abdominal pain, this involves an extensive number of treatment options, and is a complaint that carries with it a high risk of complications and morbidity.  The differential diagnosis includes cholecystitis, AAA, appendicitis, renal stone, UTI   Co morbidities that complicate the patient evaluation  Constipation, hypertension, obesity, GERD   Additional history obtained:  Additional history obtained from office visit 06/26/2024 seen with primary care.  Patient is on Wegovy.    Lab Tests:  I personally interpreted labs.  The pertinent results include:   No  leukocytosis.  No anemia.  No electrolyte abnormality.  Normal lipase.  Pregnancy test is negative. UA has small leukocytosis, no bacteria.  Does have 11-20 squamous cells.   Imaging Studies ordered:  I ordered imaging studies including ET scan of abdomen pelvis I independently visualized and interpreted imaging which showed no acute findings, nonerosive sacroiliitis, small umbilical hernia, steatosis, constipation.  I agree with the radiologist interpretation   Cardiac Monitoring: / EKG:  The patient was maintained on a cardiac monitor.    Problem List / ED Course / Critical interventions / Medication management  *** I ordered medication including ***  for ***  Reevaluation of the patient after these medicines showed that the patient {resolved/improved/worsened:23923::improved} I have reviewed the patients home medicines and have made adjustments as needed As p.o. challenge.  Feeling better.  Stable throughout stay feel appropriate for discharge with outpatient follow-up.   {Document critical care time when appropriate  Document review of labs and clinical decision tools ie CHADS2VASC2, etc  Document your independent review of radiology images and any outside records  Document your discussion with family members, caretakers and  with consultants  Document social determinants of health affecting pt's care  Document your decision making why or why not admission, treatments were needed:32947:::1}   Final diagnoses:  None    ED Discharge Orders     None
